# Patient Record
Sex: Male | Born: 1990 | Hispanic: Yes | Marital: Single | Smoking: Never smoker
Health system: Southern US, Community
[De-identification: ages and names within clinical notes are randomized; demographics above are authoritative.]

## PROBLEM LIST (undated history)

## (undated) DIAGNOSIS — H9190 Unspecified hearing loss, unspecified ear: Secondary | ICD-10-CM

---

## 2017-11-10 ENCOUNTER — Emergency Department (HOSPITAL_COMMUNITY): Payer: Medicaid Other

## 2017-11-10 ENCOUNTER — Other Ambulatory Visit: Payer: Self-pay

## 2017-11-10 ENCOUNTER — Encounter (HOSPITAL_COMMUNITY): Payer: Self-pay | Admitting: *Deleted

## 2017-11-10 ENCOUNTER — Emergency Department (HOSPITAL_COMMUNITY)
Admission: EM | Admit: 2017-11-10 | Discharge: 2017-11-10 | Disposition: A | Discharge status: Home / Self Care | Payer: Medicaid Other | Attending: Emergency Medicine | Admitting: Emergency Medicine

## 2017-11-10 DIAGNOSIS — E876 Hypokalemia: Secondary | ICD-10-CM | POA: Insufficient documentation

## 2017-11-10 DIAGNOSIS — R1084 Generalized abdominal pain: Secondary | ICD-10-CM | POA: Insufficient documentation

## 2017-11-10 DIAGNOSIS — J209 Acute bronchitis, unspecified: Secondary | ICD-10-CM

## 2017-11-10 DIAGNOSIS — R112 Nausea with vomiting, unspecified: Secondary | ICD-10-CM | POA: Insufficient documentation

## 2017-11-10 DIAGNOSIS — N410 Acute prostatitis: Secondary | ICD-10-CM

## 2017-11-10 DIAGNOSIS — R05 Cough: Secondary | ICD-10-CM | POA: Diagnosis present

## 2017-11-10 DIAGNOSIS — J189 Pneumonia, unspecified organism: Secondary | ICD-10-CM | POA: Diagnosis not present

## 2017-11-10 LAB — COMPREHENSIVE METABOLIC PANEL
ALBUMIN: 3.5 g/dL (ref 3.5–5.0)
ALT: 25 U/L (ref 0–44)
ANION GAP: 8 (ref 5–15)
AST: 33 U/L (ref 15–41)
Alkaline Phosphatase: 60 U/L (ref 38–126)
BILIRUBIN TOTAL: 1 mg/dL (ref 0.3–1.2)
BUN: 9 mg/dL (ref 6–20)
CO2: 27 mmol/L (ref 22–32)
Calcium: 8.8 mg/dL — ABNORMAL LOW (ref 8.9–10.3)
Chloride: 98 mmol/L (ref 98–111)
Creatinine, Ser: 0.83 mg/dL (ref 0.61–1.24)
GFR calc Af Amer: >60 mL/min (ref >60)
GFR calc non Af Amer: >60 mL/min (ref >60)
GLUCOSE: 105 mg/dL — AB (ref 70–99)
Potassium: 2.7 mmol/L — CL (ref 3.5–5.1)
SODIUM: 133 mmol/L — AB (ref 135–145)
TOTAL PROTEIN: 6.9 g/dL (ref 6.5–8.1)

## 2017-11-10 LAB — URINALYSIS, ROUTINE W REFLEX MICROSCOPIC
Bilirubin Urine: NEGATIVE
GLUCOSE, UA: NEGATIVE mg/dL
HGB URINE DIPSTICK: NEGATIVE
Ketones, ur: 5 mg/dL — AB
Leukocytes, UA: NEGATIVE
Nitrite: NEGATIVE
PH: 6 (ref 5.0–8.0)
Protein, ur: NEGATIVE mg/dL
SPECIFIC GRAVITY, URINE: 1.009 (ref 1.005–1.030)

## 2017-11-10 LAB — MAGNESIUM: Magnesium: 2 mg/dL (ref 1.7–2.4)

## 2017-11-10 LAB — CBC
HEMATOCRIT: 43.2 % (ref 39.0–52.0)
HEMOGLOBIN: 15.2 g/dL (ref 13.0–17.0)
MCH: 29.3 pg (ref 26.0–34.0)
MCHC: 35.2 g/dL (ref 30.0–36.0)
MCV: 83.2 fL (ref 80.0–100.0)
Platelets: 383 10*3/uL (ref 150–400)
RBC: 5.19 MIL/uL (ref 4.22–5.81)
RDW: 11.9 % (ref 11.5–15.5)
WBC: 16.7 10*3/uL — ABNORMAL HIGH (ref 4.0–10.5)
nRBC: 0 % (ref 0.0–0.2)

## 2017-11-10 LAB — LIPASE, BLOOD: Lipase: 26 U/L (ref 11–51)

## 2017-11-10 MED ORDER — IOPAMIDOL (ISOVUE-300) INJECTION 61%
100.0000 mL | Freq: Once | INTRAVENOUS | Status: AC | PRN
  Administered 2017-11-10: 100 mL via INTRAVENOUS

## 2017-11-10 MED ORDER — ONDANSETRON HCL 4 MG/2ML IJ SOLN
4.0000 mg | Freq: Once | INTRAMUSCULAR | Status: AC
  Administered 2017-11-10: 4 mg via INTRAVENOUS
  Filled 2017-11-10: qty 2

## 2017-11-10 MED ORDER — LEVOFLOXACIN 500 MG PO TABS
500.0000 mg | ORAL_TABLET | Freq: Once | ORAL | Status: AC
  Administered 2017-11-10: 500 mg via ORAL
  Filled 2017-11-10: qty 1

## 2017-11-10 MED ORDER — SODIUM CHLORIDE 0.9 % IJ SOLN
INTRAMUSCULAR | Status: AC
  Filled 2017-11-10: qty 50

## 2017-11-10 MED ORDER — POTASSIUM CHLORIDE 10 MEQ/100ML IV SOLN
10.0000 meq | INTRAVENOUS | Status: AC
  Administered 2017-11-10 (×2): 10 meq via INTRAVENOUS
  Filled 2017-11-10 (×2): qty 100

## 2017-11-10 MED ORDER — POTASSIUM CHLORIDE CRYS ER 20 MEQ PO TBCR
40.0000 meq | EXTENDED_RELEASE_TABLET | Freq: Once | ORAL | Status: AC
  Administered 2017-11-10: 40 meq via ORAL
  Filled 2017-11-10: qty 2

## 2017-11-10 MED ORDER — BENZONATATE 100 MG PO CAPS: 100.0000 mg | ORAL_CAPSULE | Freq: Three times a day (TID) | ORAL | 0 refills | Status: AC

## 2017-11-10 MED ORDER — TAMSULOSIN HCL 0.4 MG PO CAPS: 0.4000 mg | ORAL_CAPSULE | Freq: Every day | ORAL | 0 refills | Status: AC

## 2017-11-10 MED ORDER — BENZONATATE 100 MG PO CAPS
200.0000 mg | ORAL_CAPSULE | Freq: Once | ORAL | Status: AC
  Administered 2017-11-10: 200 mg via ORAL
  Filled 2017-11-10: qty 2

## 2017-11-10 MED ORDER — ONDANSETRON 8 MG PO TBDP: 8.0000 mg | ORAL_TABLET | Freq: Three times a day (TID) | ORAL | 0 refills | Status: DC | PRN

## 2017-11-10 MED ORDER — DOXYCYCLINE HYCLATE 100 MG PO CAPS: 100.0000 mg | ORAL_CAPSULE | Freq: Two times a day (BID) | ORAL | 0 refills | Status: DC

## 2017-11-10 MED ORDER — IOPAMIDOL (ISOVUE-300) INJECTION 61%
INTRAVENOUS | Status: AC
  Filled 2017-11-10: qty 100

## 2017-11-10 MED ORDER — LACTATED RINGERS IV BOLUS
1000.0000 mL | Freq: Once | INTRAVENOUS | Status: AC
  Administered 2017-11-10: 1000 mL via INTRAVENOUS

## 2017-11-10 MED ORDER — LEVOFLOXACIN 500 MG PO TABS: 500.0000 mg | ORAL_TABLET | Freq: Every day | ORAL | 0 refills | Status: AC

## 2017-11-10 MED ORDER — TAMSULOSIN HCL 0.4 MG PO CAPS
0.8000 mg | ORAL_CAPSULE | Freq: Once | ORAL | Status: AC
  Administered 2017-11-10: 0.8 mg via ORAL
  Filled 2017-11-10: qty 2

## 2017-11-10 NOTE — ED Triage Notes (Signed)
Via video interpreter, pt says he has been having cough, vomiting, dark color urine, and unable to sleep for 3 weeks. Intermittent fevers.

## 2017-11-10 NOTE — ED Notes (Signed)
Patient transported to CT 

## 2017-11-10 NOTE — ED Provider Notes (Signed)
[facility]-EMERGENCY DEPT Provider Note   CSN: [id number] Arrival date & time: 11/10/17  0128     History   Chief Complaint Chief Complaint  Patient presents with  . Cough  . Abdominal Pain    HPI Phillip Coffey is a 27 y.o. male.  HPI  26 year old male comes in with chief complaint of cough and abdominal pain. Patient is hearing impaired, and translation service was utilized..  Patient reports that he has been having cough for the last 2 or 3 weeks.  Patient has cough sometimes as clear phlegm, and other times he has blood-tinged phlegm.  He also has some chest discomfort with his cough.  Patient on review of system also complains of some URI-like symptoms.  Additionally, patient reports that he has been having abdominal pain with nausea and vomiting.  Every day he has about 5 episodes of emesis, often bilious.  There is been no blood in his emesis and he denies any diarrhea.  Abdominal pain is generalized and described as something squeezing internally.  Patient has no history of similar pain and denies any abdominal surgery.  History reviewed. No pertinent past medical history.  There are no active problems to display for this patient.   History reviewed. No pertinent surgical history.      Home Medications    Prior to Admission medications   Medication Sig Start Date End Date Taking? Authorizing Provider  benzonatate (TESSALON) 100 MG capsule Take 1 capsule (100 mg total) by mouth every 8 (eight) hours. 11/10/17   Derwood Kaplan, MD  doxycycline (VIBRAMYCIN) 100 MG capsule Take 1 capsule (100 mg total) by mouth 2 (two) times daily. 11/10/17   Derwood Kaplan, MD    Family History No family history on file.  Social History Social History   Tobacco Use  . Smoking status: Never Smoker  Substance Use Topics  . Alcohol use: Never    Frequency: Never  . Drug use: Never     Allergies   Patient has no known allergies.   Review of  Systems Review of Systems  Constitutional: Positive for activity change.  Respiratory: Positive for cough and shortness of breath.   Cardiovascular: Positive for chest pain.  Gastrointestinal: Positive for abdominal pain, nausea and vomiting.  Allergic/Immunologic: Negative for immunocompromised state.  Hematological: Does not bruise/bleed easily.  All other systems reviewed and are negative.    Physical Exam Updated Vital Signs BP 123/76   Pulse 77   Temp 98.3 F (36.8 C) (Oral)   Resp 17   Wt 59 kg   SpO2 98%   Physical Exam  Constitutional: He is oriented to person, place, and time. He appears well-developed.  HENT:  Head: Atraumatic.  Neck: Neck supple.  Cardiovascular: Normal rate.  Pulmonary/Chest: Effort normal. No respiratory distress. He has no wheezes. He has no rhonchi.  Abdominal: Normal appearance and bowel sounds are normal. He exhibits no distension. There is generalized tenderness. There is guarding. There is no rebound.  Neurological: He is alert and oriented to person, place, and time.  Skin: Skin is warm.  Nursing note and vitals reviewed.    ED Treatments / Results  Labs (all labs ordered are listed, but only abnormal results are displayed) Labs Reviewed  COMPREHENSIVE METABOLIC PANEL - Abnormal; Notable for the following components:      Result Value   Sodium 133 (*)    Potassium 2.7 (*)    Glucose, Bld 105 (*)    Calcium 8.8 (*)  All other components within normal limits  CBC - Abnormal; Notable for the following components:   WBC 16.7 (*)    All other components within normal limits  LIPASE, BLOOD  MAGNESIUM  URINALYSIS, ROUTINE W REFLEX MICROSCOPIC    EKG EKG Interpretation  Date/Time:  Saturday November 10 2017 05:57:22 EDT Ventricular Rate:  77 PR Interval:    QRS Duration: 86 QT Interval:  387 QTC Calculation: 438 R Axis:   -16 Text Interpretation:  Sinus rhythm Borderline left axis deviation No acute changes No old tracing  to compare Confirmed by Derwood Kaplan [phone]) on 11/10/2017 8:46:18 AM   Radiology Dg Chest 2 View  Result Date: 11/10/2017 CLINICAL DATA:  Cough and fever. EXAM: CHEST - 2 VIEW COMPARISON:  None. FINDINGS: Lung volumes are low.The cardiomediastinal contours are normal. Pulmonary vasculature is normal. No consolidation, pleural effusion, or pneumothorax. No acute osseous abnormalities are seen. IMPRESSION: Low lung volumes without acute chest finding. Electronically Signed   By: Narda Rutherford M.D.   On: 11/10/2017 01:57    Procedures Procedures (including critical care time)   Medications Ordered in ED Medications  potassium chloride 10 mEq in 100 mL IVPB (10 mEq Intravenous New Bag/Given 11/10/17 0842)  iopamidol (ISOVUE-300) 61 % injection 100 mL (has no administration in time range)  potassium chloride SA (K-DUR,KLOR-CON) CR tablet 40 mEq (has no administration in time range)  benzonatate (TESSALON) capsule 200 mg (200 mg Oral Given 11/10/17 0543)  ondansetron (ZOFRAN) injection 4 mg (4 mg Intravenous Given 11/10/17 0543)  lactated ringers bolus 1,000 mL (1,000 mLs Intravenous New Bag/Given 11/10/17 0738)     Initial Impression / Assessment and Plan / ED Course  I have reviewed the triage vital signs and the nursing notes.  Pertinent labs & imaging results that were available during my care of the patient were reviewed by me and considered in my medical decision making (see chart for details).     27 year old male comes in with chief complaint of abdominal pain.  He is also having cough.  CT scan of the abdomen has been ordered because of generalized tenderness with guarding.  He also has a white count of close to 17,000.  Based on information in front of me, patient is not septic.  His sirs could also be because of dehydration.  Additionally, patient has been having a cough for several days now.  We will give him some cough medication.  Chest x-ray is negative for any  acute finding.  If the CT scan is normal then we will still treat him for likely an atypical pneumonia.  Final Clinical Impressions(s) / ED Diagnoses   Final diagnoses:  Primary atypical pneumonia  Generalized abdominal pain  Acute hypokalemia    ED Discharge Orders         Ordered    benzonatate (TESSALON) 100 MG capsule  Every 8 hours     11/10/17 0841    doxycycline (VIBRAMYCIN) 100 MG capsule  2 times daily     11/10/17 0841           Derwood Kaplan, MD 11/10/17 [phone]

## 2017-11-10 NOTE — ED Notes (Signed)
Date and time results received: 11/10/17 0322 Test: Potassium Critical Value: 2.7  Name of Provider Notified: Dr. Rhunette Croft  Orders Received? Or Actions Taken?: Magnesium ordered

## 2017-11-10 NOTE — ED Provider Notes (Signed)
10:13 AM Acute bronchitis.  Patient also with evidence of acute prostatitis.  Clinically this is explained as he has had some dysuria and issues with urinary flow.  He is able to urinate now.  He will be started on Flomax and Levaquin.  This will be treat both.  Patient encouraged to follow-up in the emergency department for new or worsening symptoms.  E prescribed medications.  All questions answered.  American sign language interpreter utilized.  I personally reviewed the imaging tests through PACS system I reviewed available ER/hospitalization records through the EMR  Dg Chest 2 View  Result Date: 11/10/2017 CLINICAL DATA:  Cough and fever. EXAM: CHEST - 2 VIEW COMPARISON:  None. FINDINGS: Lung volumes are low.The cardiomediastinal contours are normal. Pulmonary vasculature is normal. No consolidation, pleural effusion, or pneumothorax. No acute osseous abnormalities are seen. IMPRESSION: Low lung volumes without acute chest finding. Electronically Signed   By: Narda Rutherford M.D.   On: 11/10/2017 01:57   Ct Abdomen Pelvis W Contrast  Result Date: 11/10/2017 CLINICAL DATA:  Nausea and vomiting. Abdominal pain, acute, generalized. Intermittent fevers. EXAM: CT ABDOMEN AND PELVIS WITH CONTRAST TECHNIQUE: Multidetector CT imaging of the abdomen and pelvis was performed using the standard protocol following bolus administration of intravenous contrast. CONTRAST:  <See Chart> ISOVUE-300 IOPAMIDOL (ISOVUE-300) INJECTION 61% COMPARISON:  None. FINDINGS: Lower chest: The lung bases are clear without focal nodule, mass, or airspace disease. The heart size is normal. No significant pleural or pericardial effusion is present. Hepatobiliary: Liver is unremarkable. A 5 mm nodule is present along the wall of the gallbladder fundus. This likely represents a gallbladder polyp. No inflammatory changes are present. The common bile duct is within normal limits. Pancreas: Unremarkable. No pancreatic ductal dilatation  or surrounding inflammatory changes. Spleen: Normal in size without focal abnormality. Adrenals/Urinary Tract: Adrenal glands are normal bilaterally. Kidneys and ureters are within normal limits. Renal enhancement is within normal limits. Ureters are unremarkable. There is marked distention of the urinary bladder extending to the level of the umbilicus. No obstructing mass lesion is present. The prostate is mildly enlarged and heterogeneously enhancing. Measures 4.8 cm in transverse diameter. Stomach/Bowel: Stomach and duodenum are within normal limits. Small bowel is unremarkable. Terminal ileum is within normal limits. The appendix is visualized and normal. The ascending and transverse colon are within normal limits. Descending and sigmoid colon are normal. Vascular/Lymphatic: No significant vascular findings are present. No enlarged abdominal or pelvic lymph nodes. Reproductive: As above, prostate is mildly enlarged and heterogeneously enhancing. Other: No abdominal wall hernia or abnormality. No abdominopelvic ascites. Musculoskeletal: Vertebral body heights and alignment are maintained. No focal lytic or blastic lesions are present. The bony pelvis is within normal limits. Hips are located and normal bilaterally. IMPRESSION: 1. The urinary bladder is markedly distended. 2. Prostate gland is enlarged with heterogeneous enhancement. Question acute prostatitis. Electronically Signed   By: Marin Roberts M.D.   On: 11/10/2017 09:41      Azalia Bilis, MD 11/10/17 1014

## 2017-11-10 NOTE — ED Notes (Signed)
Pt sleeping as Potassium continues to infuse. Will receive second run when complete.

## 2017-11-10 NOTE — ED Notes (Signed)
Patient lying in bed with eyes closed, respirations even and unlabored. In no apparent distress at this time

## 2017-11-10 NOTE — ED Triage Notes (Signed)
Pt arrived from home with c/o cough, abdominal pain, n/v/d for 3 weeks. Pt is deaf. 120/80, hr 100, 18.

## 2017-11-11 ENCOUNTER — Emergency Department (HOSPITAL_COMMUNITY)
Admission: EM | Admit: 2017-11-11 | Discharge: 2017-11-12 | Disposition: A | Discharge status: Home / Self Care | Payer: Medicaid Other | Attending: Emergency Medicine | Admitting: Emergency Medicine

## 2017-11-11 ENCOUNTER — Other Ambulatory Visit: Payer: Self-pay

## 2017-11-11 ENCOUNTER — Encounter (HOSPITAL_COMMUNITY): Payer: Self-pay

## 2017-11-11 DIAGNOSIS — K226 Gastro-esophageal laceration-hemorrhage syndrome: Secondary | ICD-10-CM

## 2017-11-11 DIAGNOSIS — R05 Cough: Secondary | ICD-10-CM | POA: Diagnosis not present

## 2017-11-11 DIAGNOSIS — R509 Fever, unspecified: Secondary | ICD-10-CM | POA: Diagnosis not present

## 2017-11-11 DIAGNOSIS — R112 Nausea with vomiting, unspecified: Secondary | ICD-10-CM

## 2017-11-11 HISTORY — DX: Unspecified hearing loss, unspecified ear: H91.90

## 2017-11-11 LAB — URINE CULTURE: Culture: NO GROWTH

## 2017-11-11 NOTE — ED Triage Notes (Signed)
Pt BIB [facility]. Pts girlfriend called EMS reference the patients pain level, fever, and active vomiting. Pt was seen here yesterday and dx with bronchitis, and prostatitis. Pt attempted to take zofran at 1600 and vomited shortly after. Temp 101 for EMS. Small swollen spot on L abd noted by EMS.

## 2017-11-11 NOTE — ED Notes (Signed)
Bed: WA14 Expected date:  Expected time:  Means of arrival:  Comments: EMS 

## 2017-11-12 ENCOUNTER — Other Ambulatory Visit: Payer: Self-pay

## 2017-11-12 ENCOUNTER — Emergency Department (HOSPITAL_COMMUNITY): Payer: Medicaid Other

## 2017-11-12 LAB — CBC WITH DIFFERENTIAL/PLATELET
Abs Immature Granulocytes: 0.07 10*3/uL (ref 0.00–0.07)
BASOS ABS: 0 10*3/uL (ref 0.0–0.1)
BASOS PCT: 0 %
EOS ABS: 0.1 10*3/uL (ref 0.0–0.5)
EOS PCT: 1 %
HCT: 40 % (ref 39.0–52.0)
Hemoglobin: 13.6 g/dL (ref 13.0–17.0)
Immature Granulocytes: 1 %
Lymphocytes Relative: 23 %
Lymphs Abs: 2.8 10*3/uL (ref 0.7–4.0)
MCH: 29.2 pg (ref 26.0–34.0)
MCHC: 34 g/dL (ref 30.0–36.0)
MCV: 86 fL (ref 80.0–100.0)
Monocytes Absolute: 1.2 10*3/uL — ABNORMAL HIGH (ref 0.1–1.0)
Monocytes Relative: 10 %
NRBC: 0 % (ref 0.0–0.2)
Neutro Abs: 8.2 10*3/uL — ABNORMAL HIGH (ref 1.7–7.7)
Neutrophils Relative %: 65 %
PLATELETS: 328 10*3/uL (ref 150–400)
RBC: 4.65 MIL/uL (ref 4.22–5.81)
RDW: 12.1 % (ref 11.5–15.5)
WBC: 12.3 10*3/uL — ABNORMAL HIGH (ref 4.0–10.5)

## 2017-11-12 LAB — COMPREHENSIVE METABOLIC PANEL
ALK PHOS: 52 U/L (ref 38–126)
ALT: 22 U/L (ref 0–44)
ANION GAP: 9 (ref 5–15)
AST: 25 U/L (ref 15–41)
Albumin: 3.1 g/dL — ABNORMAL LOW (ref 3.5–5.0)
BUN: 8 mg/dL (ref 6–20)
CALCIUM: 8.5 mg/dL — AB (ref 8.9–10.3)
CO2: 27 mmol/L (ref 22–32)
CREATININE: 0.82 mg/dL (ref 0.61–1.24)
Chloride: 99 mmol/L (ref 98–111)
Glucose, Bld: 95 mg/dL (ref 70–99)
Potassium: 3 mmol/L — ABNORMAL LOW (ref 3.5–5.1)
Sodium: 135 mmol/L (ref 135–145)
Total Bilirubin: 0.6 mg/dL (ref 0.3–1.2)
Total Protein: 6.1 g/dL — ABNORMAL LOW (ref 6.5–8.1)

## 2017-11-12 LAB — TROPONIN I

## 2017-11-12 LAB — MAGNESIUM: Magnesium: 1.9 mg/dL (ref 1.7–2.4)

## 2017-11-12 LAB — GROUP A STREP BY PCR: Group A Strep by PCR: NOT DETECTED

## 2017-11-12 LAB — MONONUCLEOSIS SCREEN: MONO SCREEN: NEGATIVE

## 2017-11-12 MED ORDER — BENZONATATE 100 MG PO CAPS
200.0000 mg | ORAL_CAPSULE | Freq: Once | ORAL | Status: AC
  Administered 2017-11-12: 200 mg via ORAL
  Filled 2017-11-12: qty 2

## 2017-11-12 MED ORDER — POTASSIUM CHLORIDE CRYS ER 20 MEQ PO TBCR
40.0000 meq | EXTENDED_RELEASE_TABLET | Freq: Once | ORAL | Status: AC
  Administered 2017-11-12: 40 meq via ORAL
  Filled 2017-11-12: qty 2

## 2017-11-12 MED ORDER — LEVOFLOXACIN 500 MG PO TABS
500.0000 mg | ORAL_TABLET | Freq: Once | ORAL | Status: AC
  Administered 2017-11-12: 500 mg via ORAL
  Filled 2017-11-12: qty 1

## 2017-11-12 MED ORDER — SODIUM CHLORIDE 0.9 % IV BOLUS
1000.0000 mL | Freq: Once | INTRAVENOUS | Status: AC
  Administered 2017-11-12: 1000 mL via INTRAVENOUS

## 2017-11-12 MED ORDER — ACETAMINOPHEN-CODEINE #3 300-30 MG PO TABS
1.0000 | ORAL_TABLET | Freq: Once | ORAL | Status: AC
  Administered 2017-11-12: 1 via ORAL
  Filled 2017-11-12: qty 1

## 2017-11-12 MED ORDER — ONDANSETRON HCL 4 MG/2ML IJ SOLN
4.0000 mg | Freq: Once | INTRAMUSCULAR | Status: AC
  Administered 2017-11-12: 4 mg via INTRAVENOUS
  Filled 2017-11-12: qty 2

## 2017-11-12 NOTE — ED Notes (Signed)
Pt states he is feeling nauseous, but his pain is no longer there

## 2017-11-12 NOTE — ED Notes (Signed)
Pt ambulatory to the bathroom w/o assistance and with steady gait 

## 2017-11-12 NOTE — ED Notes (Signed)
Pt attempted to eat soup, but kept getting more nauseous

## 2017-11-12 NOTE — ED Notes (Signed)
Pt ambulated to restroom with minimal assistance. Steady gate noted

## 2017-11-12 NOTE — Discharge Instructions (Addendum)
Please continue taking the antibiotics and the nausea medications that have been prescribed. Also continue taking Tessalon Perles for your cough. You may consider taking a teaspoon of honey at nighttime before you go to bed to help with your cough.

## 2017-11-12 NOTE — ED Notes (Signed)
Patient transported to X-ray 

## 2017-11-12 NOTE — ED Provider Notes (Signed)
[facility]-EMERGENCY DEPT Provider Note   CSN: [id number] Arrival date & time: 11/11/17  2252     History   Chief Complaint Chief Complaint  Patient presents with  . Fever  . Emesis    HPI Phillip Coffey is a 27 y.o. male.  HPI  27 year old male with history of hearing impairment comes in with chief complaint of vomiting. According to EMS patient had a fever.  Patient denies any fever and is afebrile in the ED.  Patient was seen in the ED 2 days ago.  He reports that after his discharge he was feeling better, however after he had chicken for dinner he started having vomiting again.  Patient had 4 episodes of emesis yesterday, and he reports that the vomitus was mainly food but also had some blood mixed in it.  Upon further questioning, patient states that his emesis is typically precipitated by severe cough.  He states that his cough is worse at nighttime.  Patient continues to have pain when he coughs over the left side of his chest along with epigastric abdominal discomfort.  He denies any fevers, diarrhea.  Patient is having a sore throat and some congestion.  Cough is still mostly nonproductive, but occasionally he has a white phlegm.  No history of PE.  Patient denies any recent travels.  His only sick contacts where his kids who had nonspecific upper respiratory infection-like symptoms.  Translation services utilized.  Past Medical History:  Diagnosis Date  . Deaf     There are no active problems to display for this patient.   History reviewed. No pertinent surgical history.     Home Medications    Prior to Admission medications   Medication Sig Start Date End Date Taking? Authorizing Provider  benzonatate (TESSALON) 100 MG capsule Take 1 capsule (100 mg total) by mouth every 8 (eight) hours. Patient taking differently: Take 100 mg by mouth every 8 (eight) hours as needed for cough.  11/10/17  Yes Derwood Kaplan, MD  levofloxacin (LEVAQUIN)  500 MG tablet Take 1 tablet (500 mg total) by mouth daily. 11/10/17  Yes Azalia Bilis, MD  ondansetron (ZOFRAN ODT) 8 MG disintegrating tablet Take 1 tablet (8 mg total) by mouth every 8 (eight) hours as needed for nausea or vomiting. 11/10/17  Yes Azalia Bilis, MD  tamsulosin (FLOMAX) 0.4 MG CAPS capsule Take 1 capsule (0.4 mg total) by mouth daily. 11/10/17  Frederic Jericho, MD    Family History History reviewed. No pertinent family history.  Social History Social History   Tobacco Use  . Smoking status: Never Smoker  Substance Use Topics  . Alcohol use: Never    Frequency: Never  . Drug use: Never     Allergies   Patient has no known allergies.   Review of Systems Review of Systems  Constitutional: Positive for activity change.  Respiratory: Positive for cough.   Cardiovascular: Positive for chest pain.  Gastrointestinal: Positive for vomiting.  Allergic/Immunologic: Negative for immunocompromised state.  Neurological: Positive for dizziness and light-headedness.  All other systems reviewed and are negative.    Physical Exam Updated Vital Signs BP 120/73   Pulse 85   Temp 97.7 F (36.5 C) (Oral)   Resp (!) 24   SpO2 91%   Physical Exam  Constitutional: He is oriented to person, place, and time. He appears well-developed.  HENT:  Head: Atraumatic.  Mouth/Throat: No oropharyngeal exudate.  Eyes: Pupils are equal, round, and reactive to light. EOM are  normal.  Neck: Neck supple.  Cardiovascular: Normal rate.  Pulmonary/Chest: Effort normal.  Abdominal: Soft. There is tenderness. There is no rebound and no guarding.  Epigastric and left upper quadrant tenderness  Neurological: He is alert and oriented to person, place, and time. No cranial nerve deficit. Coordination normal.  Skin: Skin is warm.  Nursing note and vitals reviewed.    ED Treatments / Results  Labs (all labs ordered are listed, but only abnormal results are displayed) Labs Reviewed    COMPREHENSIVE METABOLIC PANEL - Abnormal; Notable for the following components:      Result Value   Potassium 3.0 (*)    Calcium 8.5 (*)    Total Protein 6.1 (*)    Albumin 3.1 (*)    All other components within normal limits  CBC WITH DIFFERENTIAL/PLATELET - Abnormal; Notable for the following components:   WBC 12.3 (*)    Neutro Abs 8.2 (*)    Monocytes Absolute 1.2 (*)    All other components within normal limits  GROUP A STREP BY PCR  TROPONIN I  MONONUCLEOSIS SCREEN  MAGNESIUM    EKG None  Radiology Dg Chest 2 View  Result Date: 11/12/2017 CLINICAL DATA:  Cough and vomiting. EXAM: CHEST - 2 VIEW COMPARISON:  Chest radiograph 11/10/2017 FINDINGS: The heart size and mediastinal contours are within normal limits. Both lungs are clear. The visualized skeletal structures are unremarkable. IMPRESSION: Clear lungs. Electronically Signed   By: Deatra Robinson M.D.   On: 11/12/2017 01:33   Ct Abdomen Pelvis W Contrast  Result Date: 11/10/2017 CLINICAL DATA:  Nausea and vomiting. Abdominal pain, acute, generalized. Intermittent fevers. EXAM: CT ABDOMEN AND PELVIS WITH CONTRAST TECHNIQUE: Multidetector CT imaging of the abdomen and pelvis was performed using the standard protocol following bolus administration of intravenous contrast. CONTRAST:  <See Chart> ISOVUE-300 IOPAMIDOL (ISOVUE-300) INJECTION 61% COMPARISON:  None. FINDINGS: Lower chest: The lung bases are clear without focal nodule, mass, or airspace disease. The heart size is normal. No significant pleural or pericardial effusion is present. Hepatobiliary: Liver is unremarkable. A 5 mm nodule is present along the wall of the gallbladder fundus. This likely represents a gallbladder polyp. No inflammatory changes are present. The common bile duct is within normal limits. Pancreas: Unremarkable. No pancreatic ductal dilatation or surrounding inflammatory changes. Spleen: Normal in size without focal abnormality. Adrenals/Urinary Tract:  Adrenal glands are normal bilaterally. Kidneys and ureters are within normal limits. Renal enhancement is within normal limits. Ureters are unremarkable. There is marked distention of the urinary bladder extending to the level of the umbilicus. No obstructing mass lesion is present. The prostate is mildly enlarged and heterogeneously enhancing. Measures 4.8 cm in transverse diameter. Stomach/Bowel: Stomach and duodenum are within normal limits. Small bowel is unremarkable. Terminal ileum is within normal limits. The appendix is visualized and normal. The ascending and transverse colon are within normal limits. Descending and sigmoid colon are normal. Vascular/Lymphatic: No significant vascular findings are present. No enlarged abdominal or pelvic lymph nodes. Reproductive: As above, prostate is mildly enlarged and heterogeneously enhancing. Other: No abdominal wall hernia or abnormality. No abdominopelvic ascites. Musculoskeletal: Vertebral body heights and alignment are maintained. No focal lytic or blastic lesions are present. The bony pelvis is within normal limits. Hips are located and normal bilaterally. IMPRESSION: 1. The urinary bladder is markedly distended. 2. Prostate gland is enlarged with heterogeneous enhancement. Question acute prostatitis. Electronically Signed   By: Marin Roberts M.D.   On: 11/10/2017 09:41  Procedures Procedures (including critical care time)  Medications Ordered in ED Medications  sodium chloride 0.9 % bolus 1,000 mL (0 mLs Intravenous Stopped 11/12/17 0259)  ondansetron (ZOFRAN) injection 4 mg (4 mg Intravenous Given 11/12/17 0148)  benzonatate (TESSALON) capsule 200 mg (200 mg Oral Given 11/12/17 0149)  potassium chloride SA (K-DUR,KLOR-CON) CR tablet 40 mEq (40 mEq Oral Given 11/12/17 0331)  potassium chloride SA (K-DUR,KLOR-CON) CR tablet 40 mEq (40 mEq Oral Given 11/12/17 0442)  acetaminophen-codeine (TYLENOL #3) 300-30 MG per tablet 1 tablet (1 tablet  Oral Given 11/12/17 0442)  levofloxacin (LEVAQUIN) tablet 500 mg (500 mg Oral Given 11/12/17 0442)  sodium chloride 0.9 % bolus 1,000 mL ( Intravenous Stopped 11/12/17 0552)     Initial Impression / Assessment and Plan / ED Course  I have reviewed the triage vital signs and the nursing notes.  Pertinent labs & imaging results that were available during my care of the patient were reviewed by me and considered in my medical decision making (see chart for details).  Clinical Course as of Nov 12 724  Mon Nov 12, 2017  8119 Patient's orthostatic labs show his heart rate jumping from 60s to 100 upon standing.  Since patient is having dizziness, will give him 2 L IV fluid.  Oral challenge has been initiated.  Rest of the labs are reassuring, with white count improved compared to his prior CBC.  Nurses had just assessed the patient and reported that his pain is also resolved.   [AN]  [id number] Results from the ER workup discussed with the patient face to face and all questions answered to the best of my ability.  Patient states that he still feels dizzy.  He has been given 2 L of IV fluid.  We will recheck orthostatics and also ambulate him.  Patient denies any headache, neck pain -so although we considered dissection because of his cough, it appears to be unlikely because of lack of headache or neck pain.  As far as the symptoms are concerned, patient feels a whole lot better.  He has not vomited in the ED.  He has had some nausea, but states that the cough medication and nausea medication have helped him.  Results of the ER work-up have been discussed with the interpreter. Patient will be ambulated.  If he is unsteady, then we will proceed with a CT angiogram neck, otherwise he should be discharged with strict ER return precautions.   [AN]  [id number] Patient was orthostatic negative on repeat assessment and he ambulated without any dizziness.  He stable for discharge   [AN]    Clinical Course  User Index [AN] Derwood Kaplan, MD    27 year old male comes in with chief complaint of persistent cough and vomiting. It appears that he is having posttussive emesis.  Abdominal exam does reveal some tenderness, but that could be because of straining of his abdominal muscle from the coughing.  X-ray reordered to see if there is any developing pneumonia -it is negative.  CT scan from last visit reviewed and it appears that patient had prostatitis, therefore he was discharged with Levaquin.  We will repeat labs as well and get a mono and rapid strep. Throat exam was negative for exudate.  Final Clinical Impressions(s) / ED Diagnoses   Final diagnoses:  Non-intractable vomiting with nausea, unspecified vomiting type  Mallory-Weiss tear    ED Discharge Orders    None       Derwood Kaplan, MD 11/12/17 [phone]

## 2017-11-12 NOTE — ED Notes (Addendum)
Pt tolerating fluids with no difficulty, states he is nauseated and doesn't want any more than a few sips of soup

## 2017-11-12 NOTE — ED Notes (Signed)
Went to patient's room to give discharge instructions and go over them. Pt not in room not seen in bathrooms or department.

## 2018-09-27 DIAGNOSIS — S065X0A Traumatic subdural hemorrhage without loss of consciousness, initial encounter: Secondary | ICD-10-CM | POA: Insufficient documentation

## 2018-09-27 DIAGNOSIS — M79631 Pain in right forearm: Secondary | ICD-10-CM | POA: Diagnosis not present

## 2018-09-27 DIAGNOSIS — Z79899 Other long term (current) drug therapy: Secondary | ICD-10-CM | POA: Insufficient documentation

## 2018-09-27 DIAGNOSIS — Y999 Unspecified external cause status: Secondary | ICD-10-CM | POA: Insufficient documentation

## 2018-09-27 DIAGNOSIS — Y9389 Activity, other specified: Secondary | ICD-10-CM | POA: Diagnosis not present

## 2018-09-27 DIAGNOSIS — Y92009 Unspecified place in unspecified non-institutional (private) residence as the place of occurrence of the external cause: Secondary | ICD-10-CM | POA: Insufficient documentation

## 2018-09-28 ENCOUNTER — Other Ambulatory Visit: Payer: Self-pay

## 2018-09-28 ENCOUNTER — Encounter (HOSPITAL_COMMUNITY): Payer: Self-pay

## 2018-09-28 ENCOUNTER — Emergency Department (HOSPITAL_COMMUNITY): Payer: Medicare Other

## 2018-09-28 ENCOUNTER — Emergency Department (HOSPITAL_COMMUNITY)
Admission: EM | Admit: 2018-09-28 | Discharge: 2018-09-28 | Disposition: A | Discharge status: Home / Self Care | Payer: Medicare Other | Attending: Emergency Medicine | Admitting: Emergency Medicine

## 2018-09-28 DIAGNOSIS — M79601 Pain in right arm: Secondary | ICD-10-CM

## 2018-09-28 DIAGNOSIS — M79631 Pain in right forearm: Secondary | ICD-10-CM | POA: Diagnosis not present

## 2018-09-28 DIAGNOSIS — I6200 Nontraumatic subdural hemorrhage, unspecified: Secondary | ICD-10-CM

## 2018-09-28 MED ORDER — ONDANSETRON 4 MG PO TBDP
4.0000 mg | ORAL_TABLET | Freq: Once | ORAL | Status: AC
  Administered 2018-09-28: 4 mg via ORAL
  Filled 2018-09-28: qty 1

## 2018-09-28 MED ORDER — IBUPROFEN 800 MG PO TABS
800.0000 mg | ORAL_TABLET | Freq: Once | ORAL | Status: AC
  Administered 2018-09-28: 04:00:00 800 mg via ORAL
  Filled 2018-09-28: qty 1

## 2018-09-28 MED ORDER — ONDANSETRON 4 MG PO TBDP: 4.0000 mg | ORAL_TABLET | Freq: Three times a day (TID) | ORAL | 0 refills | Status: AC | PRN
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
## 2018-09-28 NOTE — Discharge Instructions (Signed)
You did have a small head injury today, however after observation period this can be treated and monitored at home. You may continue to have a headache for a few days which is expected. We recommend that you take tylenol or motrin as needed for pain/headache.  Can take up to 800mg  motrin 3x daily and up to 2000mg  of tylenol daily. Use the zofran as needed for nausea. Try to rest, limit TV, cell phone, computer use, reading, etc. Follow-up with your primary care doctor. Return here for any new/acute changes.
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
## 2020-02-21 IMAGING — DX DG FOREARM 2V*R*
2 series · 2 of 2 positions shown · non-contrast
Comparison: None.

CLINICAL DATA: Pain status post fall

EXAM:
RIGHT FOREARM - 2 VIEW

[forearm ap]
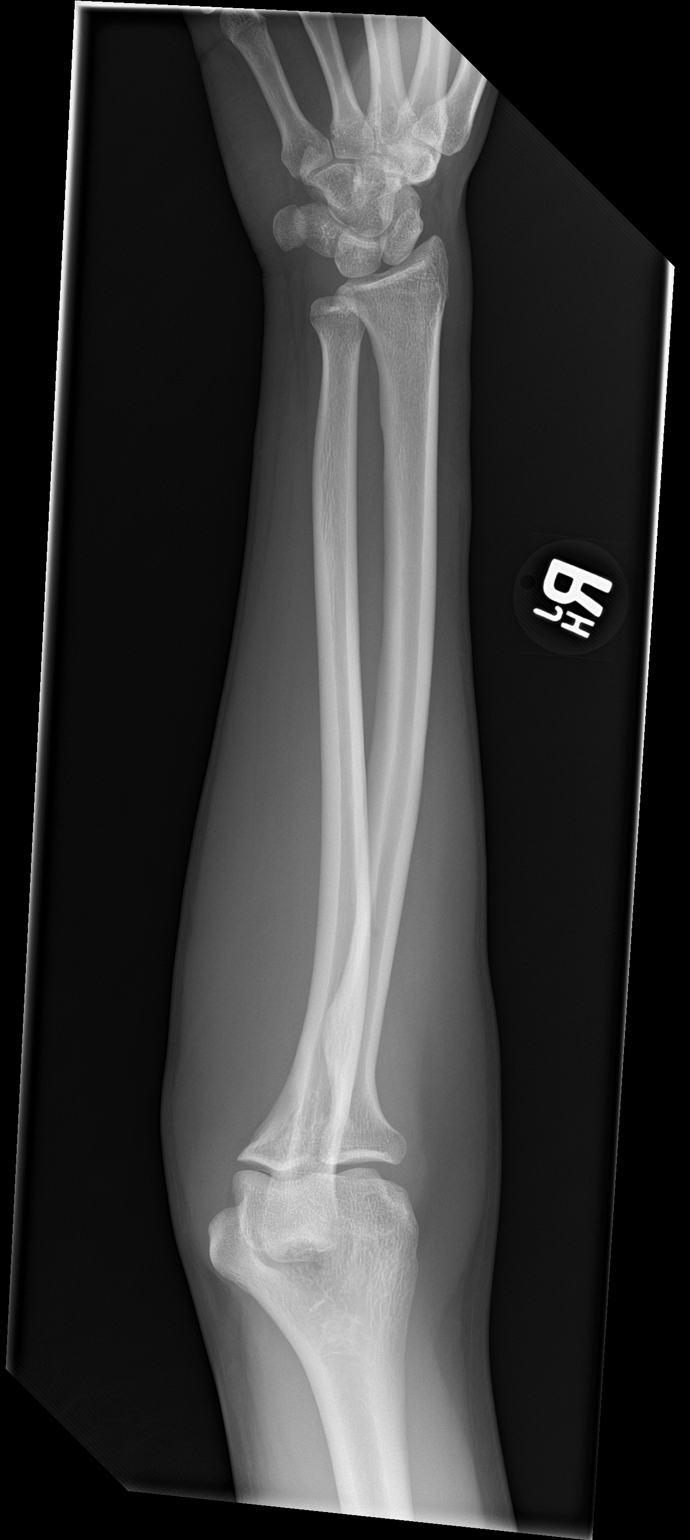

[forearm lat]
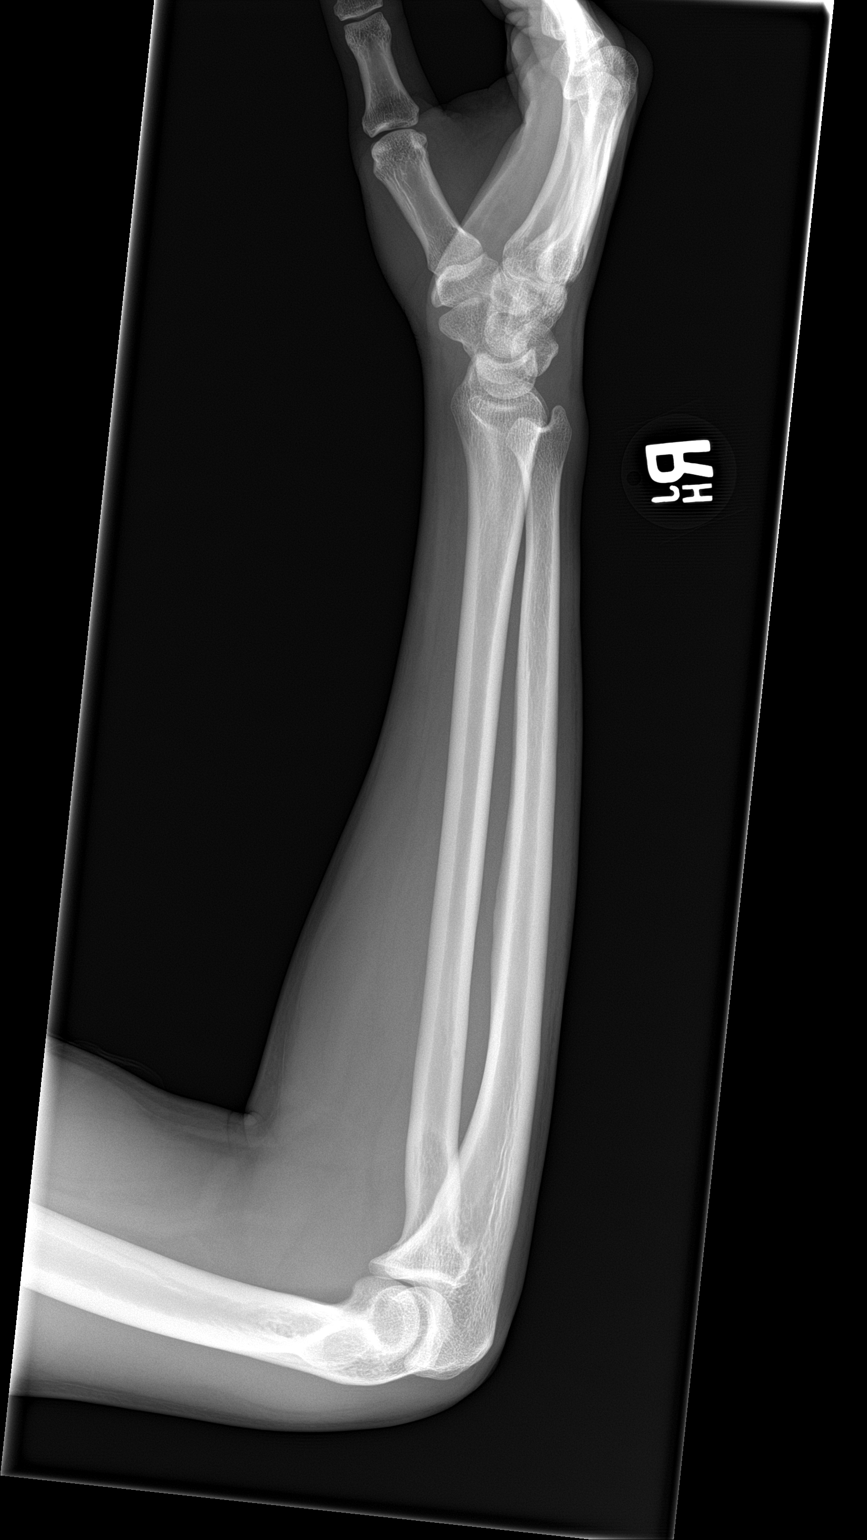

[2 of 2 positions shown; findings below may reference images not displayed]

FINDINGS: There is no evidence of fracture or other focal bone lesions. Soft
tissues are unremarkable.
IMPRESSION: No acute osseous abnormality
